# Patient Record
Sex: Male | Born: 1995 | Race: White | Hispanic: No | Marital: Single | Smoking: Never smoker
Health system: Southern US, Community
[De-identification: ages and names within clinical notes are randomized; demographics above are authoritative.]

## PROBLEM LIST (undated history)

## (undated) DIAGNOSIS — J45909 Unspecified asthma, uncomplicated: Secondary | ICD-10-CM

## (undated) HISTORY — PX: APPENDECTOMY: SHX54

---
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
## 2014-04-04 ENCOUNTER — Emergency Department (HOSPITAL_COMMUNITY)
Admission: EM | Admit: 2014-04-04 | Discharge: 2014-04-04 | Disposition: A | Discharge status: Home / Self Care | Payer: BC Managed Care – PPO | Attending: Emergency Medicine | Admitting: Emergency Medicine

## 2014-04-04 ENCOUNTER — Encounter (HOSPITAL_COMMUNITY): Payer: Self-pay | Admitting: Emergency Medicine

## 2014-04-04 DIAGNOSIS — S0100XA Unspecified open wound of scalp, initial encounter: Secondary | ICD-10-CM | POA: Insufficient documentation

## 2014-04-04 DIAGNOSIS — S0990XA Unspecified injury of head, initial encounter: Secondary | ICD-10-CM | POA: Diagnosis not present

## 2014-04-04 DIAGNOSIS — W503XXA Accidental bite by another person, initial encounter: Secondary | ICD-10-CM | POA: Diagnosis not present

## 2014-04-04 DIAGNOSIS — S0105XA Open bite of scalp, initial encounter: Secondary | ICD-10-CM

## 2014-04-04 DIAGNOSIS — Y9367 Activity, basketball: Secondary | ICD-10-CM | POA: Insufficient documentation

## 2014-04-04 DIAGNOSIS — Z23 Encounter for immunization: Secondary | ICD-10-CM | POA: Diagnosis not present

## 2014-04-04 DIAGNOSIS — J45909 Unspecified asthma, uncomplicated: Secondary | ICD-10-CM | POA: Diagnosis not present

## 2014-04-04 DIAGNOSIS — S0101XA Laceration without foreign body of scalp, initial encounter: Secondary | ICD-10-CM

## 2014-04-04 DIAGNOSIS — Y9239 Other specified sports and athletic area as the place of occurrence of the external cause: Secondary | ICD-10-CM | POA: Insufficient documentation

## 2014-04-04 DIAGNOSIS — Y92838 Other recreation area as the place of occurrence of the external cause: Secondary | ICD-10-CM

## 2014-04-04 HISTORY — DX: Unspecified asthma, uncomplicated: J45.909

## 2014-04-04 MED ORDER — AMOXICILLIN-POT CLAVULANATE 875-125 MG PO TABS
1.0000 | ORAL_TABLET | Freq: Once | ORAL | Status: AC
  Administered 2014-04-04: 1 via ORAL
  Filled 2014-04-04: qty 1

## 2014-04-04 MED ORDER — TETANUS-DIPHTH-ACELL PERTUSSIS 5-2.5-18.5 LF-MCG/0.5 IM SUSP
0.5000 mL | Freq: Once | INTRAMUSCULAR | Status: AC
  Administered 2014-04-04: 0.5 mL via INTRAMUSCULAR
  Filled 2014-04-04: qty 0.5

## 2014-04-04 MED ORDER — AMOXICILLIN-POT CLAVULANATE 875-125 MG PO TABS: 1.0000 | ORAL_TABLET | Freq: Two times a day (BID) | ORAL | Status: DC

## 2014-04-04 NOTE — ED Provider Notes (Signed)
Medical screening examination/treatment/procedure(s) were performed by non-physician practitioner and as supervising physician I was immediately available for consultation/collaboration.   EKG Interpretation None       Biana Haggar R. Keyen Marban, MD 04/04/14 2356 

## 2014-04-04 NOTE — Discharge Instructions (Signed)
Please read and follow all provided instructions.  Your diagnoses today include:  1. Head injury, initial encounter   2. Scalp laceration, initial encounter   3. Bite wound of scalp, initial encounter     Tests performed today include:  Vital signs. See below for your results today.   Medications prescribed:   Augmentin - antibiotic  You have been prescribed an antibiotic medicine: take the entire course of medicine even if you are feeling better. Stopping early can cause the antibiotic not to work.  Take any prescribed medications only as directed.   Home care instructions:  Follow any educational materials and wound care instructions contained in this packet.   Keep affected area above the level of your heart when possible to minimize swelling. Wash area gently twice a day with warm soapy water. Do not apply alcohol or hydrogen peroxide. Cover the area if it draining or weeping.   Follow-up instructions: Suture Removal: Return to the Emergency Department or see your primary care care doctor in 5 days for a recheck of your wound and removal of your sutures or staples.    Return instructions:  Return to the Emergency Department if you have:  Fever  Worsening pain  Worsening swelling of the wound  Pus draining from the wound  Redness of the skin that moves away from the wound, especially if it streaks away from the affected area   Any other emergent concerns  Your vital signs today were: BP 137/66   Pulse 80   Temp(Src) 98.5 F (36.9 C) (Oral)   Resp 20   Ht  (1.753 m)   Wt 160 lb (72.576 kg)   BMI 23.62 kg/m2   SpO2 100% If your blood pressure (BP) was elevated above 135/85 this visit, please have this repeated by your doctor within one month. --------------
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
## 2016-03-17 ENCOUNTER — Ambulatory Visit: Payer: Self-pay | Admitting: Allergy and Immunology

## 2016-03-18 ENCOUNTER — Encounter: Payer: Self-pay | Admitting: Allergy and Immunology

## 2016-03-18 ENCOUNTER — Ambulatory Visit (INDEPENDENT_AMBULATORY_CARE_PROVIDER_SITE_OTHER): Payer: BLUE CROSS/BLUE SHIELD | Admitting: Allergy and Immunology

## 2016-03-18 VITALS — BP 120/70 | HR 52 | Temp 98.0°F | Resp 16 | Ht 68.7 in | Wt 167.8 lb

## 2016-03-18 DIAGNOSIS — J4540 Moderate persistent asthma, uncomplicated: Secondary | ICD-10-CM

## 2016-03-18 DIAGNOSIS — J309 Allergic rhinitis, unspecified: Secondary | ICD-10-CM

## 2016-03-18 DIAGNOSIS — K219 Gastro-esophageal reflux disease without esophagitis: Secondary | ICD-10-CM

## 2016-03-18 DIAGNOSIS — H1010 Acute atopic conjunctivitis, unspecified eye: Secondary | ICD-10-CM

## 2016-03-18 MED ORDER — BECLOMETHASONE DIPROPIONATE 80 MCG/ACT IN AERS: 3.0000 | INHALATION_SPRAY | Freq: Three times a day (TID) | RESPIRATORY_TRACT | 3 refills | Status: DC | PRN

## 2016-03-18 NOTE — Progress Notes (Signed)
Dear Suann Larry, NP  Thank you for referring David Escobar to the [facility] [place] on 03/18/2016.   Below is a summation of this patient's evaluation and recommendations.  Thank you for your referral. I will keep you informed about this patient's response to treatment.   If you have any questions please to do hesitate to contact me.   Sincerely,  Jessica Priest, MD [facility]   ______________________________________________________________________    NEW PATIENT NOTE  Referring Provider: Liberty Handy, NP Primary Provider: Liberty Handy, NP Date of office visit: 03/18/2016    Subjective:   Chief Complaint:  David Escobar (DOB: Dec 17, 1995) is a 20 y.o. male who presents to the clinic on 03/18/2016 with a chief complaint of Asthma (coughing and wheezing) .     HPI: David Escobar presents to this clinic in evaluation of asthma and allergic rhinoconjunctivitis. He has a long history of asthma dating back to childhood for which he was evaluated by an allergist approximately 3 years ago and was given [place]. He's been using Dulera 200 at 2 puffs one time per day which is working excellent for his asthma. While utilizing this form of treatment he can exercise without any difficulty, has no cold air induced bronchospastic symptoms but does get a little bit of activity during the spring and usually ends up getting a episode of "bronchitis" during the spring for which he will use nebulized albuterol and get an antibiotic. He has not required a systemic steroid in years. His requirement for short acting bronchodilator is almost 0. Overall he is asthma has improved dramatically as he ages.  He does have mild allergic rhinoconjunctivitis with nasal congestion and sneezing for which he will rarely use over-the-counter Nasacort and some antihistamines. Spring appears to precipitate some of these  issues.  In addition, he has indigestion on at least a weekly basis without treatment. He does not consume caffeine very often and he does not eat chocolate very often or drink alcohol very often.   Past Medical History:  Diagnosis Date  . Asthma     Past Surgical History:  Procedure Laterality Date  . APPENDECTOMY    . APPENDECTOMY  2011      Medication List      DULERA 200-5 MCG/ACT Aero Generic drug:  mometasone-formoterol Inhale 2 puffs into the lungs daily.   loratadine 10 MG tablet Commonly known as:  CLARITIN Take 10 mg by mouth daily as needed for allergies.   PROAIR HFA 108 (90 Base) MCG/ACT inhaler Generic drug:  albuterol Inhale 2 puffs into the lungs every 4 (four) hours as needed for wheezing or shortness of breath.       No Known Allergies  Review of systems negative except as noted in HPI / PMHx or noted below:  Review of Systems  Constitutional: Negative.   HENT: Negative.   Eyes: Negative.   Respiratory: Negative.   Cardiovascular: Negative.   Gastrointestinal: Negative.   Genitourinary: Negative.   Musculoskeletal: Negative.   Skin: Negative.   Neurological: Negative.   Endo/Heme/Allergies: Negative.   Psychiatric/Behavioral: Negative.     Family History  Problem Relation Age of Onset  . High blood pressure Maternal Grandmother     Social History   Social History  . Marital status: Single    Spouse name: N/A  . Number of children: N/A  . Years of education: N/A   Occupational History  .  Not on file.   Social History Main Topics  . Smoking status: Never Smoker  . Smokeless tobacco: Never Used  . Alcohol use 2.4 oz/week    4 Cans of beer per week  . Drug use: No  . Sexual activity: Not on file   Other Topics Concern  . Not on file   Social History Narrative  . No narrative on file    Environmental and Social history  Lives in a house with a dry environment, no animals located inside the household, carpeting in the  bedroom, no plastic on the bed or pillow, and no smokers located inside the household. He is a Archivistcollege student at [employer]  Objective:   Vitals:   03/18/16 1006  BP: 120/70  Pulse: (!) 52  Resp: 16  Temp: 98 F (36.7 C)   Height: 5' 8.7" (174.5 cm) Weight: 167 lb 12.8 oz (76.1 kg)  Physical Exam  Constitutional: He is well-developed, well-nourished, and in no distress.  HENT:  Head: Normocephalic. Head is without right periorbital erythema and without left periorbital erythema.  Right Ear: Tympanic membrane, external ear and ear canal normal.  Left Ear: Tympanic membrane, external ear and ear canal normal.  Nose: Nose normal. No mucosal edema or rhinorrhea.  Mouth/Throat: Oropharynx is clear and moist and mucous membranes are normal. No oropharyngeal exudate.  Eyes: Conjunctivae and lids are normal. Pupils are equal, round, and reactive to light.  Neck: Trachea normal. No tracheal deviation present. No thyromegaly present.  Cardiovascular: Normal rate, regular rhythm, S1 normal, S2 normal and normal heart sounds.   No murmur heard. Pulmonary/Chest: Effort normal. No stridor. No tachypnea. No respiratory distress. He has no wheezes. He has no rales. He exhibits no tenderness.  Abdominal: Soft. He exhibits no distension and no mass. There is no hepatosplenomegaly. There is no tenderness. There is no rebound and no guarding.  Musculoskeletal: He exhibits no edema or tenderness.  Lymphadenopathy:       Head (right side): No tonsillar adenopathy present.       Head (left side): No tonsillar adenopathy present.    He has no cervical adenopathy.    He has no axillary adenopathy.  Neurological: He is alert. Gait normal.  Skin: No rash noted. He is not diaphoretic. No erythema. No pallor. Nails show no clubbing.  Psychiatric: Mood and affect normal.    Diagnostics: Allergy skin tests were not performed.   Spirometry was performed and demonstrated an FEV1 of 5.09 @ 113 % of  predicted.  The patient had an Asthma Control Test with the following results: ACT Total Score: 24.     Assessment and Plan:    1. Asthma, moderate persistent, well-controlled   2. Allergic rhinoconjunctivitis   3. Gastroesophageal reflux disease, esophagitis presence not specified     1. Continue Dulera 200 - 2 inhalations one time per day  2. Continue ProAir HFA 2 puffs every 4-6 hours if needed  3. Continue OTC antihistamine - Claritin/Zyrtec/Allegra if needed  4. Continue OTC Nasacort 2 sprays each nostril daily if needed. Takes days to work  5. Journalist, newspaper"Action plan" for asthma flare up:   A. increase Dulera 200 to twice a day  B. add Qvar 80 3 inhalations 3 times per day  C. use ProAir HFA if needed  6. Treat reflux:   A. careful about caffeine, chocolate, and alcohol  B. OTC ranitidine 150 - 2 tablets once a day  7. Obtain fall flu vaccine every year  8.  Return to clinic in 1 year or earlier if problem  David Escobar has a very stable pattern of atopic disease on medical therapy and he is very satisfied with the response that he has received with this plan and were going to continue to have him use Dulera only one time per day and he has needed use of an antihistamine and nasal steroid and I given him an action plan to initiate should he develop an asthma flare at. As well, I did address the issue of his reflux today and made some suggestions about how to treat that condition. I'll see him back in this clinic in 1 year or earlier if there is a problem.  Jessica Priest, MD [facility]

## 2016-03-18 NOTE — Patient Instructions (Addendum)
  1. Continue Dulera 200 - 2 inhalations one time per day  2. Continue ProAir HFA 2 puffs every 4-6 hours if needed  3. Continue OTC antihistamine - Claritin/Zyrtec/Allegra if needed  4. Continue OTC Nasacort 2 sprays each nostril daily if needed. Takes days to work  5. Journalist, newspaper"Action plan" for asthma flare up:   A. increase Dulera 200 to twice a day  B. add Qvar 80 3 inhalations 3 times per day  C. use ProAir HFA if needed  6. Treat reflux:   A. careful about caffeine, chocolate, and alcohol  B. OTC ranitidine 150 - 2 tablets once a day  7. Obtain fall flu vaccine every year  8. Return to clinic in 1 year or earlier if problem

## 2016-03-23 ENCOUNTER — Telehealth: Payer: Self-pay | Admitting: Allergy and Immunology

## 2016-03-23 ENCOUNTER — Other Ambulatory Visit: Payer: Self-pay | Admitting: *Deleted

## 2016-03-23 MED ORDER — ALBUTEROL SULFATE HFA 108 (90 BASE) MCG/ACT IN AERS: INHALATION_SPRAY | RESPIRATORY_TRACT | 1 refills | Status: DC

## 2016-03-23 MED ORDER — MOMETASONE FURO-FORMOTEROL FUM 200-5 MCG/ACT IN AERO: INHALATION_SPRAY | RESPIRATORY_TRACT | 5 refills | Status: DC

## 2016-03-23 NOTE — Telephone Encounter (Signed)
Proair and [employer] TireDulera sent to pharmacy. Claritin not covered by [employer].

## 2016-03-23 NOTE — Telephone Encounter (Signed)
David Escobar called in and said he only got QVAR at the pharmacy. He was expecting to be picking up Proair, dulera and claritin. He said he is not getting from another doctor. Also he does not want the ranitidine called in as he said he does not have acid reflux.    Only call if problem.

## 2016-05-22 ENCOUNTER — Emergency Department (HOSPITAL_COMMUNITY)
Admission: EM | Admit: 2016-05-22 | Discharge: 2016-05-22 | Disposition: A | Discharge status: Home / Self Care | Payer: BLUE CROSS/BLUE SHIELD | Attending: Emergency Medicine | Admitting: Emergency Medicine

## 2016-05-22 ENCOUNTER — Emergency Department (HOSPITAL_COMMUNITY): Payer: BLUE CROSS/BLUE SHIELD

## 2016-05-22 ENCOUNTER — Encounter (HOSPITAL_COMMUNITY): Payer: Self-pay

## 2016-05-22 DIAGNOSIS — S0990XA Unspecified injury of head, initial encounter: Secondary | ICD-10-CM | POA: Diagnosis not present

## 2016-05-22 DIAGNOSIS — Y929 Unspecified place or not applicable: Secondary | ICD-10-CM | POA: Insufficient documentation

## 2016-05-22 DIAGNOSIS — S0181XA Laceration without foreign body of other part of head, initial encounter: Secondary | ICD-10-CM | POA: Diagnosis not present

## 2016-05-22 DIAGNOSIS — F10120 Alcohol abuse with intoxication, uncomplicated: Secondary | ICD-10-CM | POA: Insufficient documentation

## 2016-05-22 DIAGNOSIS — Y9355 Activity, bike riding: Secondary | ICD-10-CM | POA: Insufficient documentation

## 2016-05-22 DIAGNOSIS — J45909 Unspecified asthma, uncomplicated: Secondary | ICD-10-CM | POA: Insufficient documentation

## 2016-05-22 DIAGNOSIS — Y999 Unspecified external cause status: Secondary | ICD-10-CM | POA: Diagnosis not present

## 2016-05-22 DIAGNOSIS — F10920 Alcohol use, unspecified with intoxication, uncomplicated: Secondary | ICD-10-CM

## 2016-05-22 DIAGNOSIS — S01111A Laceration without foreign body of right eyelid and periocular area, initial encounter: Secondary | ICD-10-CM

## 2016-05-22 DIAGNOSIS — S0993XA Unspecified injury of face, initial encounter: Secondary | ICD-10-CM | POA: Diagnosis present

## 2016-05-22 MED ORDER — LIDOCAINE-EPINEPHRINE (PF) 2 %-1:200000 IJ SOLN
20.0000 mL | Freq: Once | INTRAMUSCULAR | Status: AC
  Administered 2016-05-22: 20 mL
  Filled 2016-05-22: qty 20
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
## 2016-05-22 NOTE — ED Provider Notes (Signed)
LACERATION REPAIR Performed by: Thermon LeylandHedges,Chandler Swiderski Todd Authorized by: Thermon LeylandHedges,Avonell Lenig Todd Consent: Verbal consent obtained. Risks and benefits: risks, benefits and alternatives were discussed Consent given by: patient Patient identity confirmed: provided demographic data Prepped and Draped in normal sterile fashion Wound explored  Laceration Location: Right eyebrow  Laceration Length: 1 cm  No Foreign Bodies seen or palpated  Anesthesia: local infiltration  Local anesthetic: lidocaine 2 % with epinephrine  Anesthetic total: 1 ml  Irrigation method: syringe Amount of cleaning: standard  Skin closure: simple   Number of sutures: 2  Technique: Simple interrupted   Patient tolerance: Patient tolerated the procedure well with no immediate complications.   Eyvonne MechanicJeffrey Lyliana Dicenso, PA-C 05/22/16 1556    Shon Batonourtney F Horton, MD 05/22/16 [phone]

## 2017-03-24 ENCOUNTER — Other Ambulatory Visit: Payer: Self-pay | Admitting: Allergy and Immunology

## 2017-03-24 MED ORDER — ALBUTEROL SULFATE HFA 108 (90 BASE) MCG/ACT IN AERS: INHALATION_SPRAY | RESPIRATORY_TRACT | 0 refills | Status: DC

## 2017-03-24 MED ORDER — MOMETASONE FURO-FORMOTEROL FUM 200-5 MCG/ACT IN AERO: INHALATION_SPRAY | RESPIRATORY_TRACT | 0 refills | Status: DC

## 2017-03-24 NOTE — Telephone Encounter (Signed)
Sent one refill Dulera and Proair not Qvar will need to be changed at appt due to product discontinued/coverage

## 2017-03-24 NOTE — Telephone Encounter (Signed)
Damare's grandmother called in and made an [place] [facility] [place] [place] [facility] [place].  She would like a courtesy refill for [place], and [facility] [place]

## 2017-04-11 ENCOUNTER — Ambulatory Visit (INDEPENDENT_AMBULATORY_CARE_PROVIDER_SITE_OTHER): Payer: BLUE CROSS/BLUE SHIELD | Admitting: Allergy and Immunology

## 2017-04-11 ENCOUNTER — Encounter: Payer: Self-pay | Admitting: Allergy and Immunology

## 2017-04-11 VITALS — BP 120/64 | HR 60 | Resp 16 | Ht 68.5 in | Wt 170.0 lb

## 2017-04-11 DIAGNOSIS — J4540 Moderate persistent asthma, uncomplicated: Secondary | ICD-10-CM

## 2017-04-11 DIAGNOSIS — J3089 Other allergic rhinitis: Secondary | ICD-10-CM

## 2017-04-11 DIAGNOSIS — K219 Gastro-esophageal reflux disease without esophagitis: Secondary | ICD-10-CM

## 2017-04-11 NOTE — Progress Notes (Signed)
Follow-up Note  Referring Provider: Liberty Handy, NP Primary Provider: Liberty Handy, NP Date of Office Visit: 04/11/2017  Subjective:   David Escobar (DOB: [birth year]) is a 21 y.o. male who [facility]2017 in re-evaluation of the following:  HPI: David Escobar returns to this clinic in reevaluation of his asthma and allergic rhinoconjunctivitis and reflux. I have not seen him in this clinic in approximately one year.  He has done excellent while using relatively low doses of Dulera at 2 inhalations one time per day and he has not had to activate an action plan for an asthma flareup and rarely uses ProAir HFA and can exercise without any difficulty. If he does develop an upper respiratory tract infection he will get some problems with wheezing and coughing for which he will increase his Dulera to twice a day and use a rescue inhaler.  He has had very little problem with his nose while using a nasal steroid as needed. He has had very little problems with his reflux at this point what being very careful about caffeine and chocolate and alcohol and he does not need to use Zantac at this point.  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  Allergies as of 04/11/2017   No Known Allergies     Medication List      albuterol 108 (90 Base) MCG/ACT inhaler Commonly known as:  PROAIR HFA Inhale two puffs every 4-6 hours if needed for cough or wheeze   loratadine 10 MG tablet Commonly known as:  CLARITIN Take 10 mg by mouth daily as needed for allergies.   mometasone-formoterol 200-5 MCG/ACT Aero Commonly known as:  DULERA Inhale two puffs once daily to prevent cough or wheeze       Past Medical History:  Diagnosis Date  . Asthma     Past Surgical History:  Procedure Laterality Date  . APPENDECTOMY    . APPENDECTOMY  2011    Review of systems negative except as noted in HPI /  PMHx or noted below:  Review of Systems  Constitutional: Negative.   HENT: Negative.   Eyes: Negative.   Respiratory: Negative.   Cardiovascular: Negative.   Gastrointestinal: Negative.   Genitourinary: Negative.   Musculoskeletal: Negative.   Skin: Negative.   Neurological: Negative.   Endo/Heme/Allergies: Negative.   Psychiatric/Behavioral: Negative.      Objective:   Vitals:   04/11/17 1547  BP: 120/64  Pulse: 60  Resp: 16  SpO2: 98%   Height: 5' 8.5" (174 cm)  Weight: 170 lb (77.1 kg)   Physical Exam  Constitutional: He is well-developed, well-nourished, and in no distress.  HENT:  Head: Normocephalic.  Right Ear: Tympanic membrane, external ear and ear canal normal.  Left Ear: Tympanic membrane, external ear and ear canal normal.  Nose: Nose normal. No mucosal edema or rhinorrhea.  Mouth/Throat: Uvula is midline, oropharynx is clear and moist and mucous membranes are normal. No oropharyngeal exudate.  Eyes: Conjunctivae are normal.  Neck: Trachea normal. No tracheal tenderness present. No tracheal deviation present. No thyromegaly present.  Cardiovascular: Normal rate, regular rhythm, S1 normal, S2 normal and normal heart sounds.   No murmur heard. Pulmonary/Chest: Breath sounds normal. No stridor. No respiratory distress. He has no wheezes. He has no rales.  Musculoskeletal: He exhibits no edema.  Lymphadenopathy:       Head (right  side): No tonsillar adenopathy present.       Head (left side): No tonsillar adenopathy present.    He has no cervical adenopathy.  Neurological: He is alert. Gait normal.  Skin: No rash noted. He is not diaphoretic. No erythema. Nails show no clubbing.  Psychiatric: Mood and affect normal.    Diagnostics:    Spirometry was performed and demonstrated an FEV1 of 4.70 at 105 % of predicted.  Assessment and Plan:   1. Asthma, moderate persistent, well-controlled   2. Other allergic rhinitis   3. Gastroesophageal reflux  disease, esophagitis presence not specified     1. Continue Dulera 200 - 2 inhalations one time per day  2. Continue ProAir HFA 2 puffs every 4-6 hours if needed  3. Continue OTC antihistamine - Claritin/Zyrtec/Allegra if needed  4. Continue OTC Nasacort 2 sprays each nostril daily if needed.   5. "Action plan" for asthma flare up:   A. increase Dulera 200 to twice a day  B. use ProAir HFA if needed  6. Treat reflux:   A. careful about caffeine, chocolate, and alcohol  B. OTC ranitidine 150 - 2 tablets once a day  7. Obtain fall flu vaccine every year  8. Return to clinic in 1 year or earlier if problem  David Escobar appears to be doing very well on his current medical therapy and I have refilled his medications and we will see him back in this clinic in 1 year or earlier if there is a problem.  Laurette Schimke, MD Allergy / Immunology [facility]

## 2017-04-11 NOTE — Patient Instructions (Addendum)
  1. Continue Dulera 200 - 2 inhalations one time per day  2. Continue ProAir HFA 2 puffs every 4-6 hours if needed  3. Continue OTC antihistamine - Claritin/Zyrtec/Allegra if needed  4. Continue OTC Nasacort 2 sprays each nostril daily if needed.   5. "Action plan" for asthma flare up:   A. increase Dulera 200 to twice a day  B. use ProAir HFA if needed  6. Treat reflux:   A. careful about caffeine, chocolate, and alcohol  B. OTC ranitidine 150 - 2 tablets once a day  7. Obtain fall flu vaccine every year  8. Return to clinic in 1 year or earlier if problem

## 2017-04-20 ENCOUNTER — Other Ambulatory Visit: Payer: Self-pay | Admitting: Allergy and Immunology

## 2017-05-29 ENCOUNTER — Telehealth: Payer: Self-pay | Admitting: Allergy and Immunology

## 2017-05-29 MED ORDER — ALBUTEROL SULFATE HFA 108 (90 BASE) MCG/ACT IN AERS: INHALATION_SPRAY | RESPIRATORY_TRACT | 0 refills | Status: DC

## 2017-05-29 NOTE — Telephone Encounter (Signed)
[facility]

## 2017-08-05 ENCOUNTER — Encounter (HOSPITAL_COMMUNITY): Payer: Self-pay | Admitting: *Deleted

## 2017-08-05 ENCOUNTER — Other Ambulatory Visit: Payer: Self-pay

## 2017-08-05 ENCOUNTER — Ambulatory Visit (HOSPITAL_COMMUNITY)
Admission: EM | Admit: 2017-08-05 | Discharge: 2017-08-05 | Disposition: A | Discharge status: Home / Self Care | Payer: Self-pay | Attending: Family Medicine | Admitting: Family Medicine

## 2017-08-05 DIAGNOSIS — H6505 Acute serous otitis media, recurrent, left ear: Secondary | ICD-10-CM

## 2017-08-05 MED ORDER — HYDROCODONE-ACETAMINOPHEN 5-325 MG PO TABS: 1.0000 | ORAL_TABLET | Freq: Four times a day (QID) | ORAL | 0 refills | Status: DC | PRN

## 2017-08-05 MED ORDER — AMOXICILLIN-POT CLAVULANATE 875-125 MG PO TABS: 1.0000 | ORAL_TABLET | Freq: Two times a day (BID) | ORAL | 0 refills | Status: DC

## 2017-08-05 NOTE — ED Provider Notes (Signed)
  [facility]   [id number] 08/05/17 Arrival Time: 1818   SUBJECTIVE:  David Escobar is a 22 y.o. male who presents to the urgent care with complaint of left ear pain, sounds are muffled,   Past Medical History:  Diagnosis Date  . Asthma    Family History  Problem Relation Age of Onset  . High blood pressure Maternal Grandmother    Social History   Socioeconomic History  . Marital status: Single    Spouse name: Not on file  . Number of children: Not on file  . Years of education: Not on file  . Highest education level: Not on file  Social Needs  . Financial resource strain: Not on file  . Food insecurity - worry: Not on file  . Food insecurity - inability: Not on file  . Transportation needs - medical: Not on file  . Transportation needs - non-medical: Not on file  Occupational History  . Not on file  Tobacco Use  . Smoking status: Never Smoker  . Smokeless tobacco: Never Used  Substance and Sexual Activity  . Alcohol use: Yes    Alcohol/week: 2.4 oz    Types: 4 Cans of beer per week  . Drug use: No  . Sexual activity: Not on file  Other Topics Concern  . Not on file  Social History Narrative  . Not on file   No outpatient medications have been marked as taking for the 08/05/17 encounter [name].   No Known Allergies    ROS: As per HPI, remainder of ROS negative.   OBJECTIVE:   Vitals:   08/05/17 1841  BP: 123/84  Pulse: 62  Temp: (!) 97.3 F (36.3 C)     General appearance: alert; no distress Eyes: PERRL; EOMI; conjunctiva normal HENT: normocephalic; atraumatic; TMs red and bulging on left, canal normal, external ears normal without trauma; nasal mucosa normal; oral mucosa normal Neck: supple Back: no CVA tenderness Extremities: no cyanosis or edema; symmetrical with no gross deformities Skin: warm and dry Neurologic: normal gait; grossly normal Psychological: alert and cooperative; normal mood and  affect      Labs:  No results found for this or any previous visit.  Labs Reviewed - No data to display  No results found.     ASSESSMENT & PLAN:  1. Recurrent acute serous otitis media of left ear     Meds ordered this encounter  Medications  . amoxicillin-clavulanate (AUGMENTIN) 875-125 MG tablet    Sig: Take 1 tablet by mouth every 12 (twelve) hours.    Dispense:  14 tablet    Refill:  0  . HYDROcodone-acetaminophen (NORCO) 5-325 MG tablet    Sig: Take 1 tablet by mouth every 6 (six) hours as needed for moderate pain.    Dispense:  8 tablet    Refill:  0    Reviewed expectations re: course of current medical issues. Questions answered. Outlined signs and symptoms indicating need for more acute intervention. Patient verbalized understanding. After Visit Summary given.    Procedures:      Elvina SidleLauenstein, Lorelie Biermann, MD 08/05/17 1857

## 2017-08-05 NOTE — ED Triage Notes (Signed)
Per pt left ear pain, sounds are muffled,

## 2017-10-24 ENCOUNTER — Other Ambulatory Visit: Payer: Self-pay | Admitting: *Deleted

## 2017-10-24 MED ORDER — BUDESONIDE-FORMOTEROL FUMARATE 160-4.5 MCG/ACT IN AERO: 2.0000 | INHALATION_SPRAY | Freq: Two times a day (BID) | RESPIRATORY_TRACT | 5 refills | Status: DC

## 2017-10-24 NOTE — Telephone Encounter (Signed)
[facility]. Per Dr Kizzie BaneKozow will change to Symbicort 160
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
## 2018-06-12 ENCOUNTER — Telehealth: Payer: Self-pay | Admitting: Allergy and Immunology

## 2018-06-12 NOTE — Telephone Encounter (Signed)
Please call about patients asthma Patient has started a new job, and has been unable to take of work to make an appt to be seen Please call about patients inhaler, and to see if a sample needs to be given. Patient will call to set up an appt in the near future.

## 2018-06-12 NOTE — Telephone Encounter (Signed)
Patient grandmother not on DPR, so David Escobar will leave message for patient advising he needs to make appt for further refills, samples, etc

## 2018-06-14 ENCOUNTER — Encounter: Payer: Self-pay | Admitting: Allergy and Immunology

## 2018-06-14 ENCOUNTER — Ambulatory Visit: Payer: BLUE CROSS/BLUE SHIELD | Admitting: Allergy and Immunology

## 2018-06-14 VITALS — BP 122/88 | HR 68 | Temp 98.5°F | Resp 16 | Ht 69.0 in | Wt 163.0 lb

## 2018-06-14 DIAGNOSIS — K219 Gastro-esophageal reflux disease without esophagitis: Secondary | ICD-10-CM

## 2018-06-14 DIAGNOSIS — J3089 Other allergic rhinitis: Secondary | ICD-10-CM

## 2018-06-14 DIAGNOSIS — J4540 Moderate persistent asthma, uncomplicated: Secondary | ICD-10-CM

## 2018-06-14 MED ORDER — ALBUTEROL SULFATE HFA 108 (90 BASE) MCG/ACT IN AERS: INHALATION_SPRAY | RESPIRATORY_TRACT | 1 refills | Status: DC

## 2018-06-14 MED ORDER — AZITHROMYCIN 500 MG PO TABS: ORAL_TABLET | ORAL | 0 refills | Status: DC

## 2018-06-14 MED ORDER — BUDESONIDE-FORMOTEROL FUMARATE 160-4.5 MCG/ACT IN AERO: INHALATION_SPRAY | RESPIRATORY_TRACT | 3 refills | Status: DC

## 2018-06-14 NOTE — Progress Notes (Signed)
Follow-up Note  Referring Provider: Liberty Handy, NP Primary Provider: Liberty Handy, NP Date of Office Visit: 06/14/2018  Subjective:   David Escobar (DOB: 1996/03/31) is a 22 y.o. male who [facility] in re-evaluation of the following:  HPI: David Escobar returns to this clinic in reevaluation of his asthma and allergic rhinoconjunctivitis and reflux.  His last visit to this clinic was 11 April 2017.  Overall he has really done well quite well with his asthma and has not had any significant issues requiring him to receive a systemic steroid or antibiotic for an airway problem and rarely uses a short acting bronchodilator while continuing to use Symbicort 1 time per day.  Likewise, his reflux has been under excellent control while paying attention to caffeine and chocolate consumption and alcohol consumption and he does not need to use a H2 receptor blocker on a regular basis.  However, about 2 months ago he did develop problems with recurrent vomiting and he restarted his ranitidine and he has done quite well since that point in time.  And last week he developed chest tightness and congestion and coughing and aching and chills and nasal congestion and really bad sore throat.  He is not really much better today regarding his sore throat and cough.  He does not have any chest pain or sputum production or ugly nasal discharge and he has not had any fever as far as he knows.  Allergies as of 06/14/2018   No Known Allergies     Medication List      albuterol 108 (90 Base) MCG/ACT inhaler Commonly known as:  PROVENTIL HFA;VENTOLIN HFA Inhale two puffs every 4-6 hours if needed for cough or wheeze   budesonide-formoterol 160-4.5 MCG/ACT inhaler Commonly known as:  SYMBICORT Inhale 2 puffs into the lungs 2 (two) times daily.   CLARITIN-D 24 HOUR PO Take by mouth daily as needed.   DELSYM PO Take by mouth as needed.   TURMERIC  PO Take by mouth.       Past Medical History:  Diagnosis Date  . Asthma     Past Surgical History:  Procedure Laterality Date  . APPENDECTOMY    . APPENDECTOMY  2011    Review of systems negative except as noted in HPI / PMHx or noted below:  Review of Systems  Constitutional: Negative.   HENT: Negative.   Eyes: Negative.   Respiratory: Negative.   Cardiovascular: Negative.   Gastrointestinal: Negative.   Genitourinary: Negative.   Musculoskeletal: Negative.   Skin: Negative.   Neurological: Negative.   Endo/Heme/Allergies: Negative.   Psychiatric/Behavioral: Negative.      Objective:   Vitals:   06/14/18 1022  BP: 122/88  Pulse: 68  Resp: 16  Temp: 98.5 F (36.9 C)   Height: 5\' 9"  (175.3 cm)  Weight: 163 lb (73.9 kg)   Physical Exam  Constitutional:  Coughing  HENT:  Head: Normocephalic.  Right Ear: Tympanic membrane, external ear and ear canal normal.  Left Ear: Tympanic membrane, external ear and ear canal normal.  Nose: Nose normal. No mucosal edema (Erythematous) or rhinorrhea.  Mouth/Throat: Uvula is midline and mucous membranes are normal. Posterior oropharyngeal erythema present. No oropharyngeal exudate.  Eyes: Conjunctivae are normal.  Neck: Trachea normal. No tracheal tenderness present. No tracheal deviation present. No thyromegaly present.  Cardiovascular: Normal rate, regular rhythm, S1 normal, S2 normal and normal heart sounds.  No murmur heard. Pulmonary/Chest: Breath sounds  normal. No stridor. No respiratory distress. He has no wheezes. He has no rales.  Musculoskeletal: He exhibits no edema.  Lymphadenopathy:       Head (right side): No tonsillar adenopathy present.       Head (left side): No tonsillar adenopathy present.    He has no cervical adenopathy.  Neurological: He is alert.  Skin: No rash noted. He is not diaphoretic. No erythema. Nails show no clubbing.    Diagnostics:    Spirometry was performed and demonstrated an  FEV1 of 4.50 at 101 % of predicted.  The patient had an Asthma Control Test with the following results: ACT Total Score: 12.    Assessment and Plan:   1. Not well controlled moderate persistent asthma   2. Other allergic rhinitis   3. Gastroesophageal reflux disease, esophagitis presence not specified     1. Continue SYMBICORT 160 - 2 inhalations one time per day  2. Continue ProAir HFA 2 puffs every 4-6 hours if needed  3. Continue OTC antihistamine - Claritin/Zyrtec/Allegra if needed  4. Continue OTC Nasacort 2 sprays each nostril daily if needed.   5. "Action plan" for asthma flare up:   A. increase SYMBICORT 160 to twice a day  B. use ProAir HFA if needed  6. Treat reflux:   A. careful about caffeine, chocolate, and alcohol  B. OTC ranitidine 150 - 2 tablets once a day  7. For this recent episode use the following:   A. Prednisone 10mg  - 2 tablets one time per day for 3 days  B. Azithromycin 500 - one tablet one time per day for three day  C. OTC Mucinex DM - two tablets two times per day  D. Nasal saline  E. Ibuprofen   8. Obtain fall flu vaccine every year  9. Return to clinic in 1 year or earlier if problem  David Escobar appears to have developed a rather significant infection of his airway that is giving rise to inflammation of his airway for which he will utilize a combination of therapy as noted above.  Given the fact that he is on day 7 of this infection and it does not appear to be improving at all I will give him a broad-spectrum antibiotic including coverage for mycoplasma as noted.  He will continue to use anti-inflammatory agents for his airway and therapy directed against reflux and I will see him back in his clinic in 1 year or earlier if there is a problem.  Laurette SchimkeEric Kozlow, MD Allergy / Immunology [facility]

## 2018-06-14 NOTE — Patient Instructions (Addendum)
  1. Continue SYMBICORT 160 - 2 inhalations one time per day  2. Continue ProAir HFA 2 puffs every 4-6 hours if needed  3. Continue OTC antihistamine - Claritin/Zyrtec/Allegra if needed  4. Continue OTC Nasacort 2 sprays each nostril daily if needed.   5. "Action plan" for asthma flare up:   A. increase SYMBICORT 160 to twice a day  B. use ProAir HFA if needed  6. Treat reflux:   A. careful about caffeine, chocolate, and alcohol  B. OTC ranitidine 150 - 2 tablets once a day  7. For this recent episode use the following:   A. Prednisone 10mg  - 2 tablets one time per day for 3 days  B. Azithromycin 500 - one tablet one time per day for three day  C. OTC Mucinex DM - two tablets two times per day  D. Nasal saline  E. Ibuprofen   8. Obtain fall flu vaccine every year  9. Return to clinic in 1 year or earlier if problem

## 2018-06-18 ENCOUNTER — Encounter: Payer: Self-pay | Admitting: Allergy and Immunology

## 2018-07-03 ENCOUNTER — Ambulatory Visit: Payer: Self-pay

## 2018-07-03 ENCOUNTER — Ambulatory Visit: Payer: BC Managed Care – PPO | Admitting: Podiatry

## 2018-07-03 ENCOUNTER — Encounter: Payer: Self-pay | Admitting: Podiatry

## 2018-07-03 VITALS — BP 147/61 | HR 48 | Resp 16 | Ht 69.0 in | Wt 165.0 lb

## 2018-07-03 DIAGNOSIS — M24573 Contracture, unspecified ankle: Secondary | ICD-10-CM

## 2018-07-03 DIAGNOSIS — M7751 Other enthesopathy of right foot: Secondary | ICD-10-CM

## 2018-07-03 DIAGNOSIS — M7752 Other enthesopathy of left foot: Secondary | ICD-10-CM | POA: Diagnosis not present

## 2018-07-03 NOTE — Progress Notes (Signed)
  Subjective:  Patient ID: David Escobar, male    DOB: 04/08/1996,  MRN: [id number]  Chief Complaint  Patient presents with  . stiffness    BL ankle L>R (w/ a previous sprain on L foot 2 mo ago) x 1 year; stiff Tx: OTC topical pain creams, and CBD oils    22 y.o. male presents with the above complaint. 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 Thinks some part of it was from his shoes. Now wears Nikes.   Review of Systems: Negative except as noted in the HPI. Denies N/V/F/Ch.  Past Medical History:  Diagnosis Date  . Asthma     Current Outpatient Medications:  .  albuterol (PROAIR HFA) 108 (90 Base) MCG/ACT inhaler, Inhale two puffs every 4-6 hours if needed for cough or wheeze, Disp: 1 Inhaler, Rfl: 1 .  budesonide-formoterol (SYMBICORT) 160-4.5 MCG/ACT inhaler, Inhale two puffs twice daily to prevent cough or wheeze.  Rinse, gargle, and spit after use., Disp: 3 Inhaler, Rfl: 3 .  Loratadine-Pseudoephedrine (CLARITIN-D 24 HOUR PO), Take by mouth daily as needed., Disp: , Rfl:  .  TURMERIC PO, Take by mouth., Disp: , Rfl:   Social History   Tobacco Use  Smoking Status Never Smoker  Smokeless Tobacco Never Used    No Known Allergies Objective:   Vitals:   07/03/18 1652  BP: (!) 147/61  Pulse: (!) 48  Resp: 16   Body mass index is 24.37 kg/m. Constitutional Well developed. Well nourished.  Vascular Dorsalis pedis pulses palpable bilaterally. Posterior tibial pulses palpable bilaterally. Capillary refill normal to all digits.  No cyanosis or clubbing noted. Pedal hair growth normal.  Neurologic Normal speech. Oriented to person, place, and time. Epicritic sensation to light touch grossly present bilaterally.  Dermatologic Nails well groomed and normal in appearance. No open wounds. No skin lesions.  Orthopedic: Normal joint ROM without pain or crepitus bilaterally. No visible  deformities. Pain palpation about the posterior ankle capsule bilateral with decreased ankle joint range of motion   Radiographs: Taken reviewed no acute fractures dislocations Assessment:   1. Capsulitis of left ankle   2. Capsulitis of right ankle   3. Equinus contracture of ankle    Plan:  Patient was evaluated and treated and all questions answered.  Posterior ankle capsule tightness -Educated etiology of pain and stiffness.  Educated on proper stretching exercises.  Would benefit from custom orthotics casted for them today.  No follow-ups on file.

## 2018-07-03 NOTE — Patient Instructions (Signed)
Achilles Tendinitis  with Rehab Achilles tendinitis is a disorder of the Achilles tendon. The Achilles tendon connects the large calf muscles (Gastrocnemius and Soleus) to the heel bone (calcaneus). This tendon is sometimes called the heel cord. It is important for pushing-off and standing on your toes and is important for walking, running, or jumping. Tendinitis is often caused by overuse and repetitive microtrauma. SYMPTOMS  Pain, tenderness, swelling, warmth, and redness may occur over the Achilles tendon even at rest.  Pain with pushing off, or flexing or extending the ankle.  Pain that is worsened after or during activity. CAUSES   Overuse sometimes seen with rapid increase in exercise programs or in sports requiring running and jumping.  Poor physical conditioning (strength and flexibility or endurance).  Running sports, especially training running down hills.  Inadequate warm-up before practice or play or failure to stretch before participation.  Injury to the tendon. PREVENTION   Warm up and stretch before practice or competition.  Allow time for adequate rest and recovery between practices and competition.  Keep up conditioning.  Keep up ankle and leg flexibility.  Improve or keep muscle strength and endurance.  Improve cardiovascular fitness.  Use proper technique.  Use proper equipment (shoes, skates).  To help prevent recurrence, taping, protective strapping, or an adhesive bandage may be recommended for several weeks after healing is complete. PROGNOSIS   Recovery may take weeks to several months to heal.  Longer recovery is expected if symptoms have been prolonged.  Recovery is usually quicker if the inflammation is due to a direct blow as compared with overuse or sudden strain. RELATED COMPLICATIONS   Healing time will be prolonged if the condition is not correctly treated. The injury must be given plenty of time to heal.  Symptoms can reoccur if  activity is resumed too soon.  Untreated, tendinitis may increase the risk of tendon rupture requiring additional time for recovery and possibly surgery. TREATMENT   The first treatment consists of rest anti-inflammatory medication, and ice to relieve the pain.  Stretching and strengthening exercises after resolution of pain will likely help reduce the risk of recurrence. Referral to a physical therapist or athletic trainer for further evaluation and treatment may be helpful.  A walking boot or cast may be recommended to rest the Achilles tendon. This can help break the cycle of inflammation and microtrauma.  Arch supports (orthotics) may be prescribed or recommended by your caregiver as an adjunct to therapy and rest.  Surgery to remove the inflamed tendon lining or degenerated tendon tissue is rarely necessary and has shown less than predictable results. MEDICATION   Nonsteroidal anti-inflammatory medications, such as aspirin and ibuprofen, may be used for pain and inflammation relief. Do not take within 7 days before surgery. Take these as directed by your caregiver. Contact your caregiver immediately if any bleeding, stomach upset, or signs of allergic reaction occur. Other minor pain relievers, such as acetaminophen, may also be used.  Pain relievers may be prescribed as necessary by your caregiver. Do not take prescription pain medication for longer than 4 to 7 days. Use only as directed and only as much as you need.  Cortisone injections are rarely indicated. Cortisone injections may weaken tendons and predispose to rupture. It is better to give the condition more time to heal than to use them. HEAT AND COLD  Cold is used to relieve pain and reduce inflammation for acute and chronic Achilles tendinitis. Cold should be applied for 10 to 15 minutes   every 2 to 3 hours for inflammation and pain and immediately after any activity that aggravates your symptoms. Use ice packs or an ice  massage.  Heat may be used before performing stretching and strengthening activities prescribed by your caregiver. Use a heat pack or a warm soak. SEEK MEDICAL CARE IF:  Symptoms get worse or do not improve in 2 weeks despite treatment.  New, unexplained symptoms develop. Drugs used in treatment may produce side effects.  EXERCISES:  RANGE OF MOTION (ROM) AND STRETCHING EXERCISES - Achilles Tendinitis  These exercises may help you when beginning to rehabilitate your injury. Your symptoms may resolve with or without further involvement from your physician, physical therapist or athletic trainer. While completing these exercises, remember:   Restoring tissue flexibility helps normal motion to return to the joints. This allows healthier, less painful movement and activity.  An effective stretch should be held for at least 30 seconds.  A stretch should never be painful. You should only feel a gentle lengthening or release in the stretched tissue.  STRETCH  Gastroc, Standing   Place hands on wall.  Extend right / left leg, keeping the front knee somewhat bent.  Slightly point your toes inward on your back foot.  Keeping your right / left heel on the floor and your knee straight, shift your weight toward the wall, not allowing your back to arch.  You should feel a gentle stretch in the right / left calf. Hold this position for 10 seconds. Repeat 3 times. Complete this stretch 2 times per day.  STRETCH  Soleus, Standing   Place hands on wall.  Extend right / left leg, keeping the other knee somewhat bent.  Slightly point your toes inward on your back foot.  Keep your right / left heel on the floor, bend your back knee, and slightly shift your weight over the back leg so that you feel a gentle stretch deep in your back calf.  Hold this position for 10 seconds. Repeat 3 times. Complete this stretch 2 times per day.  STRETCH  Gastrocsoleus, Standing  Note: This exercise can place  a lot of stress on your foot and ankle. Please complete this exercise only if specifically instructed by your caregiver.   Place the ball of your right / left foot on a step, keeping your other foot firmly on the same step.  Hold on to the wall or a rail for balance.  Slowly lift your other foot, allowing your body weight to press your heel down over the edge of the step.  You should feel a stretch in your right / left calf.  Hold this position for 10 seconds.  Repeat this exercise with a slight bend in your knee. Repeat 3 times. Complete this stretch 2 times per day.   STRENGTHENING EXERCISES - Achilles Tendinitis These exercises may help you when beginning to rehabilitate your injury. They may resolve your symptoms with or without further involvement from your physician, physical therapist or athletic trainer. While completing these exercises, remember:   Muscles can gain both the endurance and the strength needed for everyday activities through controlled exercises.  Complete these exercises as instructed by your physician, physical therapist or athletic trainer. Progress the resistance and repetitions only as guided.  You may experience muscle soreness or fatigue, but the pain or discomfort you are trying to eliminate should never worsen during these exercises. If this pain does worsen, stop and make certain you are following the directions exactly. If   the pain is still present after adjustments, discontinue the exercise until you can discuss the trouble with your clinician.  STRENGTH - Plantar-flexors   Sit with your right / left leg extended. Holding onto both ends of a rubber exercise band/tubing, loop it around the ball of your foot. Keep a slight tension in the band.  Slowly push your toes away from you, pointing them downward.  Hold this position for 10 seconds. Return slowly, controlling the tension in the band/tubing. Repeat 3 times. Complete this exercise 2 times per day.     STRENGTH - Plantar-flexors   Stand with your feet shoulder width apart. Steady yourself with a wall or table using as little support as needed.  Keeping your weight evenly spread over the width of your feet, rise up on your toes.*  Hold this position for 10 seconds. Repeat 3 times. Complete this exercise 2 times per day.  *If this is too easy, shift your weight toward your right / left leg until you feel challenged. Ultimately, you may be asked to do this exercise with your right / left foot only.  STRENGTH  Plantar-flexors, Eccentric  Note: This exercise can place a lot of stress on your foot and ankle. Please complete this exercise only if specifically instructed by your caregiver.   Place the balls of your feet on a step. With your hands, use only enough support from a wall or rail to keep your balance.  Keep your knees straight and rise up on your toes.  Slowly shift your weight entirely to your right / left toes and pick up your opposite foot. Gently and with controlled movement, lower your weight through your right / left foot so that your heel drops below the level of the step. You will feel a slight stretch in the back of your calf at the end position.  Use the healthy leg to help rise up onto the balls of both feet, then lower weight only on the right / left leg again. Build up to 15 repetitions. Then progress to 3 consecutive sets of 15 repetitions.*  After completing the above exercise, complete the same exercise with a slight knee bend (about 30 degrees). Again, build up to 15 repetitions. Then progress to 3 consecutive sets of 15 repetitions.* Perform this exercise 2 times per day.  *When you easily complete 3 sets of 15, your physician, physical therapist or athletic trainer may advise you to add resistance by wearing a backpack filled with additional weight.  STRENGTH - Plantar Flexors, Seated   Sit on a chair that allows your feet to rest flat on the ground. If  necessary, sit at the edge of the chair.  Keeping your toes firmly on the ground, lift your right / left heel as far as you can without increasing any discomfort in your ankle. Repeat 3 times. Complete this exercise 2 times a day.   Posterior Ankle Impingement and Posterior Process Rehab Ask your health care provider which exercises are safe for you. Do exercises exactly as told by your health care provider and adjust them as directed. It is normal to feel mild stretching, pulling, tightness, or discomfort as you do these exercises, but you should stop right away if you feel sudden pain or your pain gets worse.Do not begin these exercises until told by your health care provider. Stretching and range of motion exercises These exercises warm up your muscles and joints and improve the movement and flexibility of your ankle. These exercises  also help to relieve pain, numbness, and tingling. Exercise A: Toe extension/toe flexion  1. Sit with your left / right leg crossed over your opposite knee. 2. With your left / right hand, hold your toes and gently pull them toward the top of your foot. You should feel a stretch at the bottom of your toes and foot. 3. Hold this stretch for __________ seconds. 4. Gently pull your toes toward the bottom of your foot. You should feel a stretch on the top of your toes and foot. 5. Hold this stretch for __________ seconds. Repeat __________ times. Complete this exercise __________ times a day. Exercise B: Ankle dorsiflexion, active-assisted  1. Sit on a chair that is placed on a non-carpeted surface. 2. Place your left / right foot on the floor, directly under your knee. Extend your other leg for support. 3. Keeping your heel down, slide your left / right foot back toward the chair until you feel a stretch at your ankle or the back of your lower leg (calf). If you do not feel a stretch, slide your buttocks forward to the edge of the chair while keeping your heel  down. 4. Hold this stretch for __________ seconds. Repeat __________ times. Complete this exercise __________ times a day. Exercise C: Gastroc stretch, standing  1. Stand with your hands against a wall. 2. Extend your left / right leg behind you, and bend your front knee slightly. Your heels should be on the floor. 3. Point the toes on your back foot slightly inward. 4. Keeping your heels on the floor and your back knee straight, shift your weight toward the wall. You should feel a gentle stretch in your calf. 5. Hold this position for __________ seconds. Repeat __________ times. Complete this exercise __________ times a day. Exercise D: Soleus stretch, standing 1. Stand with your hands against a wall. 2. Extend your left / right leg behind you, and bend your front knee slightly. Your heels should be on the floor. 3. Point the toes on your back foot slightly inward. 4. Keeping your heels on the floor, bend your back knee and slightly shift your weight over the back leg. You should feel a gentle stretch deep in your calf. 5. Hold this position for __________ seconds. Repeat __________ times. Complete this exercise __________ times a day. Exercise E: Gastroc and soleus stretch, standing  1. Stand on the edge of a step on the balls of your feet. The ball of your foot is on the walking surface, right under your toes. Hold onto a rail or a chair for balance. 2. Slowly lift your uninjured foot, allowing your body weight to press your left / right heel down over the edge of the step. You should feel a stretch in your left / right calf. 3. Hold this position for __________ seconds. Repeat __________ times with your left / right knee straight and __________ times with your left / right knee bent. Complete this exercise __________ times a day. Strengthening exercises These exercises build strength and endurance in your ankle. Endurance is the ability to use your muscles for a long time, even after  they get tired. Exercise F: Plantar flexors, seated  1. Sit on the floor with your left / right leg extended. 2. Loop a rubber exercise band or tube around the ball of your left / right foot. The ball of your foot is on the walking surface, right under your toes. The band or tube should be slightly tense when  your foot is relaxed. 3. Slowly point your toes downward, pushing them away from you. 4. Hold this position for __________ seconds. 5. Let the band or tube slowly pull your foot back to the starting position. Repeat __________ times. Complete this exercise __________ times a day. Exercise G: Plantar flexors, standing  1. Stand with your feet shoulder-width apart. 2. Keep your weight spread evenly over the width of your feet while you rise up on your toes. Use a wall or table to steady yourself, but try not to use very much for support. 3. If this exercise is too easy, try these options: ? Shift your weight toward your left / right leg until you feel challenged. ? If told by your health care provider, stand on your left / right leg only. 4. Hold this position for __________ seconds. Repeat __________ times. Complete this exercise __________ times a day. Exercise H: Towel curls  1. Sit in a chair on a non-carpeted surface, and put your feet on the floor. 2. Place a towel in front of your feet. If told by your health care provider, add __________ to the end of the towel. 3. Keeping your heel on the floor, put your left / right foot on the towel. 4. Pull the towel toward you by grabbing the towel with your toes and curling them under. Keep your heel on the floor. Repeat __________ times. Complete this exercise __________ times a day. This information is not intended to replace advice given to you by your health care provider. Make sure you discuss any questions you have with your health care provider. Document Released: 07/11/2005 Document Revised: 03/17/2016 Document Reviewed:  04/29/2015 Elsevier Interactive Patient Education  2018 [employer].

## 2018-07-03 NOTE — Progress Notes (Signed)
   Subjective:    Patient ID: David MareBrandon Escobar, male    DOB: 1996-06-07, 22 y.o.   MRN: [id number]  HPI    Review of Systems  All other systems reviewed and are negative.      Objective:   Physical Exam        Assessment & Plan:

## 2018-08-29 ENCOUNTER — Ambulatory Visit: Payer: BC Managed Care – PPO | Admitting: Allergy and Immunology

## 2018-08-29 ENCOUNTER — Encounter: Payer: Self-pay | Admitting: Allergy and Immunology

## 2018-08-29 VITALS — BP 140/80 | HR 72 | Temp 97.9°F | Resp 16

## 2018-08-29 DIAGNOSIS — K219 Gastro-esophageal reflux disease without esophagitis: Secondary | ICD-10-CM

## 2018-08-29 DIAGNOSIS — J4530 Mild persistent asthma, uncomplicated: Secondary | ICD-10-CM

## 2018-08-29 DIAGNOSIS — J3089 Other allergic rhinitis: Secondary | ICD-10-CM

## 2018-08-29 NOTE — Patient Instructions (Addendum)
  1. Continue SYMBICORT 160 - 2 inhalations one time per day  2. If needed:   A. ProAir HFA 2 puffs every 4-6 hours   B. OTC antihistamine / decongestant  C. OTC Nasacort 1-2 sprays each nostril daily   3. "Action plan" for asthma flare up:   A. increase SYMBICORT 160 to twice a day  B. use ProAir HFA if needed  4. Treat reflux:   A. careful about caffeine, chocolate, and alcohol  B. OTC ranitidine 150 - 2 tablets once a day  5. For this recent episode use the following:   A. Prednisone 10mg  - 1 tablet one time per day for 5 days  B. OTC Mucinex DM - two tablets two times per day  C. OTC antihistamine / decongestant (Claritin-D)  D. Nasal saline  E. Ibuprofen   6. Return to clinic in 1 year or earlier if problem

## 2018-08-29 NOTE — Progress Notes (Signed)
Follow-up Note  Referring Provider: No ref. provider found Primary Provider: Patient, No Pcp Per Date of Office Visit: 08/29/2018  Subjective:   David Escobar (DOB: February 07, 1996) is a 23 y.o. male [facility] in re-evaluation of the following:  HPI: David Escobar returns to this clinic in reevaluation of his asthma and allergic rhinitis and reflux.  I last saw him in this clinic on 14 June 2018 at which point in time he was doing relatively well.  He has continued to do well with his asthma and usually does not require the use of a short acting bronchodilator while he continues to utilize Symbicort on a regular basis.  He has had very little problems with his nose while intermittently and rarely using any Nasacort.  He has not required a systemic steroid or an antibiotic to treat any type of airway issue.  His reflux has really been doing quite well as long as he is careful about caffeine and chocolate and alcohol.  He does not use ranitidine on a consistent basis.  Unfortunately, 3 days ago, he developed nasal congestion and stuffiness and sore throat.  2 days ago he became much worse and actually vomited and left work and and continued to have problems with lots of stuffiness and had very severe sore throat and sneezing and nose blowing and slight yellow postnasal drip for which he went to the urgent care center and was diagnosed with a sinus infection and bronchitis and given a collection of medical treatment including high-dose steroids and antibiotics which he has not used.  He does not have any associated chest pain or shortness of breath or high fever or headaches or ugly nasal discharge.  Allergies as of 08/29/2018   No Known Allergies     Medication List      albuterol 108 (90 Base) MCG/ACT inhaler Commonly known as:  PROAIR HFA Inhale two puffs every 4-6 hours if needed for cough or wheeze   budesonide-formoterol 160-4.5  MCG/ACT inhaler Commonly known as:  SYMBICORT Inhale two puffs twice daily to prevent cough or wheeze.  Rinse, gargle, and spit after use.   CLARITIN-D 24 HOUR PO Take by mouth daily as needed.       Past Medical History:  Diagnosis Date  . Asthma     Past Surgical History:  Procedure Laterality Date  . APPENDECTOMY    . APPENDECTOMY  2011    Review of systems negative except as noted in HPI / PMHx or noted below:  Review of Systems  Constitutional: Negative.   HENT: Negative.   Eyes: Negative.   Respiratory: Negative.   Cardiovascular: Negative.   Gastrointestinal: Negative.   Genitourinary: Negative.   Musculoskeletal: Negative.   Skin: Negative.   Neurological: Negative.   Endo/Heme/Allergies: Negative.   Psychiatric/Behavioral: Negative.      Objective:   Vitals:   08/29/18 1033  BP: 140/80  Pulse: 72  Resp: 16  Temp: 97.9 F (36.6 C)          Physical Exam Constitutional:      Appearance: He is not diaphoretic.  HENT:     Head: Normocephalic.     Right Ear: Tympanic membrane, ear canal and external ear normal.     Left Ear: Tympanic membrane, ear canal and external ear normal.     Nose: Nose normal. No mucosal edema or rhinorrhea.     Mouth/Throat:     Pharynx: Uvula midline. Posterior  oropharyngeal erythema present. No oropharyngeal exudate.  Eyes:     Conjunctiva/sclera: Conjunctivae normal.  Neck:     Thyroid: No thyromegaly.     Trachea: Trachea normal. No tracheal tenderness or tracheal deviation.  Cardiovascular:     Rate and Rhythm: Normal rate and regular rhythm.     Heart sounds: Normal heart sounds, S1 normal and S2 normal. No murmur.  Pulmonary:     Effort: No respiratory distress.     Breath sounds: Normal breath sounds. No stridor. No wheezing or rales.  Lymphadenopathy:     Head:     Right side of head: No tonsillar adenopathy.     Left side of head: No tonsillar adenopathy.     Cervical: No cervical adenopathy.  Skin:     Findings: No erythema or rash.     Nails: There is no clubbing.   Neurological:     Mental Status: He is alert.     Diagnostics:    Spirometry was performed and demonstrated an FEV1 of 5.22 at 117 % of predicted.  The patient had an Asthma Control Test with the following results: ACT Total Score: 20.    Assessment and Plan:   1. Not well controlled mild persistent asthma   2. Other allergic rhinitis   3. Gastroesophageal reflux disease, esophagitis presence not specified     1. Continue SYMBICORT 160 - 2 inhalations one time per day  2. If needed:   A. ProAir HFA 2 puffs every 4-6 hours   B. OTC antihistamine / decongestant  C. OTC Nasacort 1-2 sprays each nostril daily   3. "Action plan" for asthma flare up:   A. increase SYMBICORT 160 to twice a day  B. use ProAir HFA if needed  4. Treat reflux:   A. careful about caffeine, chocolate, and alcohol  B. OTC ranitidine 150 - 2 tablets once a day  5. For this recent episode use the following:   A. Prednisone 10mg  - 1 tablet one time per day for 5 days  B. OTC Mucinex DM - two tablets two times per day  C. OTC antihistamine / decongestant (Claritin-D)  D. Nasal saline  E. Ibuprofen   6. Return to clinic in 1 year or earlier if problem  Carnell appears to have a viral respiratory tract infection that is giving rise to some inflammation of his airway and he will utilize the plan of therapy noted above and keep in contact with me noting his response.  If he does well I will see him back in this clinic in 1 year as previously arranged.  Laurette Schimke, MD Allergy / Immunology [facility]

## 2018-08-30 ENCOUNTER — Encounter: Payer: Self-pay | Admitting: Allergy and Immunology

## 2018-09-13 NOTE — Addendum Note (Signed)
Addended by: ALMEIDA GARCIA, Anahli Arvanitis on: 09/13/2018 08:28 AM   Modules accepted: Orders  

## 2018-10-25 ENCOUNTER — Other Ambulatory Visit: Payer: BC Managed Care – PPO

## 2019-04-11 ENCOUNTER — Other Ambulatory Visit: Payer: Self-pay

## 2019-04-11 ENCOUNTER — Ambulatory Visit (INDEPENDENT_AMBULATORY_CARE_PROVIDER_SITE_OTHER): Payer: BC Managed Care – PPO | Admitting: Orthotics

## 2019-04-11 DIAGNOSIS — M7752 Other enthesopathy of left foot: Secondary | ICD-10-CM

## 2019-04-11 DIAGNOSIS — M7751 Other enthesopathy of right foot: Secondary | ICD-10-CM | POA: Diagnosis not present

## 2019-04-11 NOTE — Progress Notes (Signed)
Patient came in today to pick up custom made foot orthotics.  The goals were accomplished and the patient reported no dissatisfaction with said orthotics.  Patient was advised of breakin period and how to report any issues. 

## 2020-05-05 ENCOUNTER — Other Ambulatory Visit: Payer: Self-pay | Admitting: Allergy and Immunology

## 2020-06-17 ENCOUNTER — Other Ambulatory Visit: Payer: Self-pay

## 2020-06-17 ENCOUNTER — Ambulatory Visit (INDEPENDENT_AMBULATORY_CARE_PROVIDER_SITE_OTHER): Payer: PRIVATE HEALTH INSURANCE | Admitting: Allergy and Immunology

## 2020-06-17 VITALS — BP 128/64 | HR 60 | Resp 14 | Ht 70.5 in | Wt 162.0 lb

## 2020-06-17 DIAGNOSIS — J3089 Other allergic rhinitis: Secondary | ICD-10-CM | POA: Diagnosis not present

## 2020-06-17 DIAGNOSIS — J4540 Moderate persistent asthma, uncomplicated: Secondary | ICD-10-CM | POA: Diagnosis not present

## 2020-06-17 NOTE — Progress Notes (Signed)
- [place] - [place] - [place] - [place]   Follow-up Note  Referring Provider: No ref. provider found Primary Provider: Patient, No Pcp Per Date of Office Visit: 06/17/2020  Subjective:   David Escobar (DOB: [birth year]) is a 24 y.o. male who [facility] in re-evaluation of the following:  HPI: David Escobar returns to this clinic in reevaluation of asthma and allergic rhinitis and reflux.  His last visit to this clinic was 29 August 2018.  He has continued to do very well since his last visit without the need for systemic steroid or antibiotic for any type of airway issue and rare use of a short acting bronchodilator and he can exercise without difficulty.  In fact, he is now trying out for a [employer] basketball team.  He continues to use Symbicort mostly 1 time per day but occasionally increases up to twice a day.  He intermittently uses Nasacort.  He has received 2 Moderna Covid vaccinations to date.  Allergies as of 06/17/2020   No Known Allergies     Medication List      albuterol 108 (90 Base) MCG/ACT inhaler Commonly known as: ProAir HFA Inhale two puffs every 4-6 hours if needed for cough or wheeze   budesonide-formoterol 160-4.5 MCG/ACT inhaler Commonly known as: Symbicort Inhale two puffs twice daily to prevent cough or wheeze.  Rinse, gargle, and spit after use.   CLARITIN-D 24 HOUR PO Take by mouth daily as needed.       Past Medical History:  Diagnosis Date  . Asthma     Past Surgical History:  Procedure Laterality Date  . APPENDECTOMY    . APPENDECTOMY  2011    Review of systems negative except as noted in HPI / PMHx or noted below:  Review of Systems  Constitutional: Negative.   HENT: Negative.   Eyes: Negative.   Respiratory: Negative.   Cardiovascular: Negative.   Gastrointestinal: Negative.   Genitourinary: Negative.   Musculoskeletal: Negative.   Skin: Negative.     Neurological: Negative.   Endo/Heme/Allergies: Negative.   Psychiatric/Behavioral: Negative.      Objective:   Vitals:   06/17/20 1059  BP: 128/64  Pulse: 60  Resp: 14  SpO2: 98%   Height: 5' 10.5" (179.1 cm)  Weight: 162 lb (73.5 kg)   Physical Exam Constitutional:      Appearance: He is not diaphoretic.  HENT:     Head: Normocephalic.     Right Ear: Tympanic membrane, ear canal and external ear normal.     Left Ear: Tympanic membrane, ear canal and external ear normal.     Nose: Nose normal. No mucosal edema or rhinorrhea.     Mouth/Throat:     Pharynx: Uvula midline. No oropharyngeal exudate.  Eyes:     Conjunctiva/sclera: Conjunctivae normal.  Neck:     Thyroid: No thyromegaly.     Trachea: Trachea normal. No tracheal tenderness or tracheal deviation.  Cardiovascular:     Rate and Rhythm: Normal rate and regular rhythm.     Heart sounds: Normal heart sounds, S1 normal and S2 normal. No murmur heard.   Pulmonary:     Effort: No respiratory distress.     Breath sounds: Normal breath sounds. No stridor. No wheezing or rales.  Lymphadenopathy:     Head:     Right side of head: No tonsillar adenopathy.     Left side of head: No tonsillar adenopathy.  Cervical: No cervical adenopathy.  Skin:    Findings: No erythema or rash.     Nails: There is no clubbing.  Neurological:     Mental Status: He is alert.     Diagnostics:    Spirometry was performed and demonstrated an FEV1 of 4.88 at 107 % of predicted.   Assessment and Plan:   1. Asthma, moderate persistent, well-controlled   2. Other allergic rhinitis     1. Continue SYMBICORT 160 - 2 inhalations one time per day  2. If needed:   A. ProAir HFA 2 puffs every 4-6 hours   B. OTC antihistamine / decongestant  C. OTC Nasacort 1-2 sprays each nostril daily   3. "Action plan" for asthma flare up:   A. increase SYMBICORT 160 to twice a day  B. use ProAir HFA if needed  4. Return to clinic in 1  year or earlier if problem  5. Obtain flu vaccine and Covid booster  David Escobar has really done very well on his current plan.  He has a very good understanding of his disease state and how his medications work and about manipulating his dose of medications depending on disease activity.  Assuming he continues to do well with the plan noted above I will see him back in this clinic in 1 year or earlier if there is a problem.  Laurette Schimke, MD Allergy / Immunology [facility]

## 2020-06-17 NOTE — Patient Instructions (Addendum)
°  1. Continue SYMBICORT 160 - 2 inhalations one time per day  2. If needed:   A. ProAir HFA 2 puffs every 4-6 hours   B. OTC antihistamine / decongestant  C. OTC Nasacort 1-2 sprays each nostril daily   3. "Action plan" for asthma flare up:   A. increase SYMBICORT 160 to twice a day  B. use ProAir HFA if needed  4. Return to clinic in 1 year or earlier if problem  5. Obtain flu vaccine and Covid booster

## 2020-06-22 ENCOUNTER — Encounter: Payer: Self-pay | Admitting: Allergy and Immunology

## 2020-06-22 MED ORDER — SYMBICORT 160-4.5 MCG/ACT IN AERO: INHALATION_SPRAY | RESPIRATORY_TRACT | 5 refills | Status: DC

## 2020-06-23 ENCOUNTER — Telehealth: Payer: Self-pay | Admitting: Allergy and Immunology

## 2020-06-23 ENCOUNTER — Other Ambulatory Visit: Payer: Self-pay | Admitting: *Deleted

## 2020-06-23 MED ORDER — ALBUTEROL SULFATE HFA 108 (90 BASE) MCG/ACT IN AERS: INHALATION_SPRAY | RESPIRATORY_TRACT | 1 refills | Status: DC

## 2020-06-23 NOTE — Telephone Encounter (Signed)
ProAir refill has been sent in to requested pharmacy. Called grandma and advised that we unfortunately do not have any coupons for Symbicort. She is going to call and see how much generic Symbicort would be with his insurance. He did go ahead and pick up the first Symbicort. Grandma will call with an update to see if there is something further we can do to help save the patient money.

## 2020-06-23 NOTE — Telephone Encounter (Signed)
Patient needs a refill on Proair sent to [facility] in [place]. Patient would like to know if we have any coupons for Symbicort, because it was over $200 with [facility]. Please call patient first, but if he does not answer, call grandmother Bonita Quin at [phone].  Please advise.

## 2020-10-29 ENCOUNTER — Other Ambulatory Visit: Payer: Self-pay | Admitting: *Deleted

## 2020-10-29 ENCOUNTER — Telehealth: Payer: Self-pay | Admitting: Allergy and Immunology

## 2020-10-29 MED ORDER — BUDESONIDE-FORMOTEROL FUMARATE 160-4.5 MCG/ACT IN AERO: INHALATION_SPRAY | RESPIRATORY_TRACT | 3 refills | Status: DC

## 2020-10-29 NOTE — Telephone Encounter (Signed)
Patient grandmother called to see if there is a generic symbicort or something else little cheaper. [phone]

## 2020-10-29 NOTE — Telephone Encounter (Signed)
Sent in generic Symbicort and informed grandmother.

## 2021-04-12 ENCOUNTER — Other Ambulatory Visit: Payer: Self-pay | Admitting: Allergy and Immunology

## 2022-05-11 ENCOUNTER — Encounter: Payer: Self-pay | Admitting: Allergy and Immunology

## 2022-05-11 ENCOUNTER — Ambulatory Visit: Payer: BC Managed Care – PPO | Admitting: Allergy and Immunology

## 2022-05-11 VITALS — BP 104/70 | HR 50 | Temp 97.8°F | Resp 18 | Ht 69.2 in | Wt 163.0 lb

## 2022-05-11 DIAGNOSIS — J4540 Moderate persistent asthma, uncomplicated: Secondary | ICD-10-CM | POA: Diagnosis not present

## 2022-05-11 DIAGNOSIS — J3089 Other allergic rhinitis: Secondary | ICD-10-CM

## 2022-05-11 MED ORDER — SYMBICORT 160-4.5 MCG/ACT IN AERO: INHALATION_SPRAY | RESPIRATORY_TRACT | 11 refills | Status: DC

## 2022-05-11 MED ORDER — AIRSUPRA 90-80 MCG/ACT IN AERO: 2.0000 | INHALATION_SPRAY | RESPIRATORY_TRACT | 1 refills | Status: AC | PRN

## 2022-05-11 NOTE — Patient Instructions (Addendum)
  1. RESTART SYMBICORT 160 - 2 inhalations 1-2 times a day  2. START prednisone 10 mg - 1 tablet 1 time per day for 10 days  3. If needed:   A.  Air supra 2 puffs every 4-6 hours (coupon)  B. OTC antihistamine / decongestant  C. OTC Nasacort 1-2 sprays each nostril daily   4. "Action plan" for asthma flare up:   A. increase SYMBICORT 160 to twice a day  B. use Air Supra if needed  5. Return to clinic in 1 year or earlier if problem  6. Obtain flu vaccine

## 2022-05-11 NOTE — Progress Notes (Unsigned)
[place] - [place] - [place] - [place] - [place]   Follow-up Note  Referring Provider: No ref. provider found Primary Provider: Patient, No Pcp Per Date of Office Visit: 05/11/2022  Subjective:   David Escobar (DOB: 30-Jun-1996) is a 26 y.o. male who [facility] in re-evaluation of the following:  HPI: Mykle returns to this clinic in evaluation of asthma and allergic conjunctivitis.  His last visit to this clinic was 17 June 2020.  He continues to use Symbicort usually 1 time per day with very good results and he had excellent control of his asthma without the requirement for albuterol and without the requirement for systemic steroid to treat an exacerbation.  Unfortunately, he has been out of his Symbicort for the past few months and has had a lumbar cough and has had some requirement for albuterol.  He has had very little issues with his upper airway at this point time but he does have some "head congestion and fullness that he has noticed over the course of the past few weeks.  He can smell and is not a history of ugly nasal discharge or other neurological symptoms.  Allergies as of 05/11/2022   No Known Allergies      Medication List    VITAMIN C PO Take by mouth.        Past Medical History:  Diagnosis Date   Asthma     Past Surgical History:  Procedure Laterality Date   APPENDECTOMY     APPENDECTOMY  2011    Review of systems negative except as noted in HPI / PMHx or noted below:  Review of Systems  Constitutional: Negative.   HENT: Negative.    Eyes: Negative.   Respiratory: Negative.    Cardiovascular: Negative.   Gastrointestinal: Negative.   Genitourinary: Negative.   Musculoskeletal: Negative.   Skin: Negative.   Neurological: Negative.   Endo/Heme/Allergies: Negative.   Psychiatric/Behavioral: Negative.       Objective:   Vitals:   05/11/22 1345  BP: 104/70  Pulse: (!) 50   Resp: 18  Temp: 97.8 F (36.6 C)  SpO2: 99%   Height: 5' 9.2" (175.8 cm)  Weight: 163 lb (73.9 kg)   Physical Exam Constitutional:      Appearance: He is not diaphoretic.     Comments: Intermittent cough  HENT:     Head: Normocephalic.     Right Ear: Tympanic membrane, ear canal and external ear normal.     Left Ear: Tympanic membrane, ear canal and external ear normal.     Nose: Nose normal. No mucosal edema or rhinorrhea.     Mouth/Throat:     Pharynx: Uvula midline. No oropharyngeal exudate.  Eyes:     Conjunctiva/sclera: Conjunctivae normal.  Neck:     Thyroid: No thyromegaly.     Trachea: Trachea normal. No tracheal tenderness or tracheal deviation.  Cardiovascular:     Rate and Rhythm: Normal rate and regular rhythm.     Heart sounds: Normal heart sounds, S1 normal and S2 normal. No murmur heard. Pulmonary:     Effort: No respiratory distress.     Breath sounds: Normal breath sounds. No stridor. No wheezing or rales.  Lymphadenopathy:     Head:     Right side of head: No tonsillar adenopathy.     Left side of head: No tonsillar adenopathy.     Cervical: No cervical adenopathy.  Skin:    Findings: No  erythema or rash.     Nails: There is no clubbing.  Neurological:     Mental Status: He is alert.     Diagnostics:    Spirometry was performed and demonstrated an FEV1 of 4.54 at 102 % of predicted.  Assessment and Plan:   1. Not well controlled moderate persistent asthma   2. Other allergic rhinitis     1. RESTART SYMBICORT 160 - 2 inhalations 1-2 times a day  2. START prednisone 10 mg - 1 tablet 1 time per day for 10 days  3. If needed:   A.  Air supra 2 puffs every 4-6 hours (coupon)  B. OTC antihistamine / decongestant  C. OTC Nasacort 1-2 sprays each nostril daily   4. "Action plan" for asthma flare up:   A. increase SYMBICORT 160 to twice a day  B. use Air Supra if needed  5. Return to clinic in 1 year or earlier if problem  6. Obtain flu  vaccine   Janie will restart his Symbicort initially twice a day and if he does well he can taper down to 1 time per day.  We will give him a combination albuterol/budesonide rescue inhaler to use as needed.  We will start him on a low-dose of prednisone for the next 10 days to relieve any inflammation that may be caving rise to his respiratory tract symptoms.  Assuming he does well with this plan I will see him back in this clinic in 1 year or earlier if there is a problem.  Allena Katz, MD Allergy / Immunology [place]

## 2022-05-12 ENCOUNTER — Encounter: Payer: Self-pay | Admitting: Allergy and Immunology

## 2022-10-19 ENCOUNTER — Other Ambulatory Visit: Payer: Self-pay | Admitting: Allergy and Immunology

## 2022-10-20 NOTE — Telephone Encounter (Signed)
Would you like to switch to [facility] for the patient?

## 2022-10-25 NOTE — Telephone Encounter (Signed)
New prescription has been sent in. Attempted to call patient to inform, there was no answer and voicemail was full. Will need to attempt to call again to inform.

## 2022-10-28 NOTE — Telephone Encounter (Signed)
Attempted to call patient, left a voicemail asking for a return call to inform of change in inhaler.
